# Patient Record
Sex: Male | Born: 2005
Health system: Southern US, Community
[De-identification: ages and names within clinical notes are randomized; demographics above are authoritative.]

## PROBLEM LIST (undated history)

## (undated) DIAGNOSIS — J45909 Unspecified asthma, uncomplicated: Secondary | ICD-10-CM

---
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
## 2024-02-03 NOTE — Progress Notes (Unsigned)
" °  Darlyn Claudene [facility] [address] [place] [address] Phone: [phone] Subjective:    I'm seeing this patient by the request  of:  Patient, No Pcp Per  CC: Back pain  YEP:Dlagzrupcz  Keith Felten Kwan is a 19 y.o. male coming in with complaint of back pain. Last seen in 2024 for knee pain. Patient states    Did have x-rays in 2021 that showed normal spacing and no instability with flexion extension.  Past Medical History:  Diagnosis Date   Asthma    No past surgical history on file. Social History   Socioeconomic History   Marital status: Single    Spouse name: Not on file   Number of children: Not on file   Years of education: Not on file   Highest education level: Not on file  Occupational History   Not on file  Tobacco Use   Smoking status: Never   Smokeless tobacco: Never  Substance and Sexual Activity   Alcohol use: Not on file   Drug use: Not on file   Sexual activity: Not on file  Other Topics Concern   Not on file  Social History Narrative   Not on file   Social Drivers of Health   Tobacco Use: Low Risk (07/01/2022)   Patient History    Smoking Tobacco Use: Never    Smokeless Tobacco Use: Never    Passive Exposure: Not on file  Financial Resource Strain: Not on file  Food Insecurity: Not on file  Transportation Needs: Not on file  Physical Activity: Not on file  Stress: Not on file  Social Connections: Not on file  Depression (EYV7-0): Not on file  Alcohol Screen: Not on file  Housing: Not on file  Utilities: Not on file  Health Literacy: Not on file   Allergies[1] No family history on file.  Current Outpatient Medications (Analgesics):    meloxicam  (MOBIC ) 15 MG tablet, Take 1 tablet (15 mg total) by mouth daily.   Reviewed prior external information including notes and imaging from  primary care provider As well as notes that were available from care everywhere and other healthcare systems.  Past medical  history, social, surgical and family history all reviewed in electronic medical record.  No pertanent information unless stated regarding to the chief complaint.   Review of Systems:  No headache, visual changes, nausea, vomiting, diarrhea, constipation, dizziness, abdominal pain, skin rash, fevers, chills, night sweats, weight loss, swollen lymph nodes, body aches, joint swelling, chest pain, shortness of breath, mood changes. POSITIVE muscle aches  Objective  There were no vitals taken for this visit.   General: No apparent distress alert and oriented x3 mood and affect normal, dressed appropriately.  HEENT: Pupils equal, extraocular movements intact  Respiratory: Patient's speak in full sentences and does not appear short of breath  Cardiovascular: No lower extremity edema, non tender, no erythema  Low back exam shows    Impression and Recommendations:    The above documentation has been reviewed and is accurate and complete Arthea CHRISTELLA Claudene, DO        [1] Not on File  "

## 2024-02-04 ENCOUNTER — Ambulatory Visit: Admitting: Family Medicine
